# Patient Record
Sex: Male | Born: 1970 | Race: White | Hispanic: No | Smoking: Former smoker
Health system: Southern US, Community
[De-identification: ages and names within clinical notes are randomized; demographics above are authoritative.]

## PROBLEM LIST (undated history)

## (undated) DIAGNOSIS — T7840XA Allergy, unspecified, initial encounter: Secondary | ICD-10-CM

## (undated) HISTORY — PX: TONSILLECTOMY AND ADENOIDECTOMY: SUR1326

## (undated) HISTORY — DX: Allergy, unspecified, initial encounter: T78.40XA

---

## 2000-08-27 ENCOUNTER — Ambulatory Visit (HOSPITAL_BASED_OUTPATIENT_CLINIC_OR_DEPARTMENT_OTHER): Admission: RE | Admit: 2000-08-27 | Discharge: 2000-08-27 | Payer: Self-pay | Admitting: General Surgery

## 2014-09-18 ENCOUNTER — Ambulatory Visit (INDEPENDENT_AMBULATORY_CARE_PROVIDER_SITE_OTHER): Payer: BLUE CROSS/BLUE SHIELD | Admitting: Family Medicine

## 2014-09-18 ENCOUNTER — Encounter: Payer: Self-pay | Admitting: Family Medicine

## 2014-09-18 VITALS — BP 114/80 | HR 65 | Ht 73.0 in | Wt 170.0 lb

## 2014-09-18 DIAGNOSIS — A630 Anogenital (venereal) warts: Secondary | ICD-10-CM | POA: Diagnosis not present

## 2014-09-18 DIAGNOSIS — Z23 Encounter for immunization: Secondary | ICD-10-CM

## 2014-09-18 DIAGNOSIS — Z209 Contact with and (suspected) exposure to unspecified communicable disease: Secondary | ICD-10-CM

## 2014-09-18 DIAGNOSIS — Z0000 Encounter for general adult medical examination without abnormal findings: Secondary | ICD-10-CM | POA: Diagnosis not present

## 2014-09-18 LAB — CBC WITH DIFFERENTIAL/PLATELET
BASOS ABS: 0 10*3/uL (ref 0.0–0.1)
Basophils Relative: 0 % (ref 0–1)
EOS ABS: 0.1 10*3/uL (ref 0.0–0.7)
EOS PCT: 1 % (ref 0–5)
HCT: 42.9 % (ref 39.0–52.0)
HEMOGLOBIN: 14.3 g/dL (ref 13.0–17.0)
Lymphocytes Relative: 29 % (ref 12–46)
Lymphs Abs: 2.9 10*3/uL (ref 0.7–4.0)
MCH: 31 pg (ref 26.0–34.0)
MCHC: 33.3 g/dL (ref 30.0–36.0)
MCV: 92.9 fL (ref 78.0–100.0)
MPV: 10.8 fL (ref 8.6–12.4)
Monocytes Absolute: 1 10*3/uL (ref 0.1–1.0)
Monocytes Relative: 10 % (ref 3–12)
NEUTROS PCT: 60 % (ref 43–77)
Neutro Abs: 6 10*3/uL (ref 1.7–7.7)
Platelets: 317 10*3/uL (ref 150–400)
RBC: 4.62 MIL/uL (ref 4.22–5.81)
RDW: 13.7 % (ref 11.5–15.5)
WBC: 10 10*3/uL (ref 4.0–10.5)

## 2014-09-18 LAB — LIPID PANEL
CHOLESTEROL: 197 mg/dL (ref 0–200)
HDL: 58 mg/dL (ref >=40)
LDL CALC: 127 mg/dL — AB (ref 0–99)
Total CHOL/HDL Ratio: 3.4 Ratio
Triglycerides: 62 mg/dL (ref <150)
VLDL: 12 mg/dL (ref 0–40)

## 2014-09-18 LAB — COMPREHENSIVE METABOLIC PANEL
ALBUMIN: 4.2 g/dL (ref 3.5–5.2)
ALK PHOS: 48 U/L (ref 39–117)
ALT: 10 U/L (ref 0–53)
AST: 14 U/L (ref 0–37)
BILIRUBIN TOTAL: 0.5 mg/dL (ref 0.2–1.2)
BUN: 10 mg/dL (ref 6–23)
CHLORIDE: 101 meq/L (ref 96–112)
CO2: 28 meq/L (ref 19–32)
CREATININE: 0.76 mg/dL (ref 0.50–1.35)
Calcium: 10 mg/dL (ref 8.4–10.5)
Glucose, Bld: 99 mg/dL (ref 70–99)
Potassium: 4.4 mEq/L (ref 3.5–5.3)
Sodium: 138 mEq/L (ref 135–145)
Total Protein: 7 g/dL (ref 6.0–8.3)
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
## 2014-09-18 NOTE — Patient Instructions (Signed)
Mike Leach [phone] 

## 2014-09-19 LAB — GC/CHLAMYDIA PROBE AMP, URINE
Chlamydia, Swab/Urine, PCR: NEGATIVE
GC PROBE AMP, URINE: NEGATIVE

## 2014-09-19 LAB — HIV ANTIBODY (ROUTINE TESTING W REFLEX): HIV 1&2 Ab, 4th Generation: NONREACTIVE

## 2014-09-19 LAB — RPR

## 2014-09-24 NOTE — Progress Notes (Signed)
Make sure the patient knows the results were negative

## 2016-01-08 ENCOUNTER — Encounter: Payer: Self-pay | Admitting: Family Medicine

## 2016-01-08 ENCOUNTER — Ambulatory Visit (INDEPENDENT_AMBULATORY_CARE_PROVIDER_SITE_OTHER): Payer: BLUE CROSS/BLUE SHIELD | Admitting: Family Medicine

## 2016-01-08 VITALS — BP 112/60 | HR 60 | Resp 16 | Wt 166.2 lb

## 2016-01-08 DIAGNOSIS — M79606 Pain in leg, unspecified: Secondary | ICD-10-CM

## 2016-01-08 NOTE — Progress Notes (Signed)
   Subjective:    Patient ID: Mike Leach, male    DOB: 21-Jul-1970, 45 y.o.   MRN: [id number]  HPI He complains of back pain that has been intermittent in nature. It would first be on one side and then the opposite side depending upon which side he was spending the most time on while working. He works as a Conservation officer, naturecashier. He has been doing some stretching for this. He does recognize that his work plays a major role in this. He did have one episode of tingling sensation in his left leg with symptoms suggestive of L5 radiculopathy with a slight flip-flop that went away within less than a day.  Review of Systems     Objective:   Physical Exam Alert and in no distress. Slight tenderness to palpation over the left SI joint with provocative testing causing some discomfort. Normal hip motion. Negative straight leg raising.       Assessment & Plan:  Musculoskeletal pain of lower extremity, unspecified laterality Discussed the fact that since the pain moved from one side to the other based on his work posturing, this is really musculoskeletal in nature. Discussed in detail proper ergonomics in terms of keeping his back straight and lifting with his legs and not doing any rotating type motions. At the end of the encounter, his friend came in and stated that he was apparently having difficulty with depression. He did not make mention of this well with me. Since there was some suicidal thoughts, I recommended that he go to was along the emergency room. See note from Laureen Ochsiana Turner

## 2016-01-08 NOTE — Patient Instructions (Signed)
Work on keeping your back in the neutral position and not twisting intact. Stretching before and after work is fine

## 2016-02-04 ENCOUNTER — Encounter: Payer: Self-pay | Admitting: Family Medicine

## 2016-02-04 ENCOUNTER — Ambulatory Visit (INDEPENDENT_AMBULATORY_CARE_PROVIDER_SITE_OTHER): Payer: BLUE CROSS/BLUE SHIELD | Admitting: Licensed Clinical Social Worker

## 2016-02-04 ENCOUNTER — Ambulatory Visit (INDEPENDENT_AMBULATORY_CARE_PROVIDER_SITE_OTHER): Payer: BLUE CROSS/BLUE SHIELD | Admitting: Family Medicine

## 2016-02-04 VITALS — BP 120/80 | HR 60 | Ht 71.5 in | Wt 169.0 lb

## 2016-02-04 DIAGNOSIS — E785 Hyperlipidemia, unspecified: Secondary | ICD-10-CM | POA: Diagnosis not present

## 2016-02-04 DIAGNOSIS — Z8249 Family history of ischemic heart disease and other diseases of the circulatory system: Secondary | ICD-10-CM

## 2016-02-04 DIAGNOSIS — Z0000 Encounter for general adult medical examination without abnormal findings: Secondary | ICD-10-CM | POA: Diagnosis not present

## 2016-02-04 DIAGNOSIS — F329 Major depressive disorder, single episode, unspecified: Secondary | ICD-10-CM

## 2016-02-04 DIAGNOSIS — J301 Allergic rhinitis due to pollen: Secondary | ICD-10-CM | POA: Insufficient documentation

## 2016-02-04 DIAGNOSIS — F321 Major depressive disorder, single episode, moderate: Secondary | ICD-10-CM

## 2016-02-04 LAB — CBC WITH DIFFERENTIAL/PLATELET
BASOS ABS: 0 {cells}/uL (ref 0–200)
Basophils Relative: 0 %
EOS ABS: 75 {cells}/uL (ref 15–500)
Eosinophils Relative: 1 %
HCT: 42.3 % (ref 38.5–50.0)
HEMOGLOBIN: 14 g/dL (ref 13.2–17.1)
LYMPHS ABS: 2550 {cells}/uL (ref 850–3900)
Lymphocytes Relative: 34 %
MCH: 31.4 pg (ref 27.0–33.0)
MCHC: 33.1 g/dL (ref 32.0–36.0)
MCV: 94.8 fL (ref 80.0–100.0)
MONOS PCT: 12 %
MPV: 10.7 fL (ref 7.5–12.5)
Monocytes Absolute: 900 cells/uL (ref 200–950)
NEUTROS ABS: 3975 {cells}/uL (ref 1500–7800)
Neutrophils Relative %: 53 %
Platelets: 338 10*3/uL (ref 140–400)
RBC: 4.46 MIL/uL (ref 4.20–5.80)
RDW: 14 % (ref 11.0–15.0)
WBC: 7.5 10*3/uL (ref 4.0–10.5)

## 2016-02-04 LAB — COMPREHENSIVE METABOLIC PANEL
ALBUMIN: 4.3 g/dL (ref 3.6–5.1)
ALT: 13 U/L (ref 9–46)
AST: 17 U/L (ref 10–40)
Alkaline Phosphatase: 52 U/L (ref 40–115)
BUN: 11 mg/dL (ref 7–25)
CHLORIDE: 101 mmol/L (ref 98–110)
CO2: 27 mmol/L (ref 20–31)
Calcium: 9 mg/dL (ref 8.6–10.3)
Creat: 0.73 mg/dL (ref 0.60–1.35)
Glucose, Bld: 98 mg/dL (ref 65–99)
POTASSIUM: 4 mmol/L (ref 3.5–5.3)
Sodium: 137 mmol/L (ref 135–146)
TOTAL PROTEIN: 7 g/dL (ref 6.1–8.1)
Total Bilirubin: 0.4 mg/dL (ref 0.2–1.2)

## 2016-02-04 LAB — LIPID PANEL
CHOL/HDL RATIO: 2.6 ratio (ref <=5.0)
CHOLESTEROL: 184 mg/dL (ref 125–200)
HDL: 71 mg/dL (ref >=40)
LDL Cholesterol: 103 mg/dL (ref <130)
TRIGLYCERIDES: 52 mg/dL (ref <150)
VLDL: 10 mg/dL (ref <30)

## 2016-02-04 MED ORDER — CITALOPRAM HYDROBROMIDE 20 MG PO TABS: 20.0000 mg | ORAL_TABLET | Freq: Every day | ORAL | 3 refills | Status: DC

## 2016-02-04 NOTE — Patient Instructions (Addendum)
Try Flonase nasal spray your allergies Call me in 3 weeks and let me know how you're doing

## 2016-02-04 NOTE — Progress Notes (Signed)
Subjective:    Patient ID: Mike Leach, male    DOB: [birth year], 45 y.o.   MRN: [id number]  HPI Here for complete exam. He is now involved in counseling with Mike Leach. She thinks that he would do well with an antidepressant. He still is having difficulty with low mood, anhedonia, decreased energy. He is having difficulty dealing with a failed relationship. He is making progress with her and does feel good about this. He does have underlying allergies and does occasionally use Benadryl mainly in the spring. Review of his record does indicate a slightly elevated LDL. He has a family history of heart disease with his father having an MI at age 50. He continues working 3 separate jobs. He seems be doing somewhat better with his musculoskeletal issues. Otherwise he has no concerns or complaints. Family and social history as well as health maintenance and immunizations were reviewed.   Review of Systems  All other systems reviewed and are negative.      Objective:   Physical Exam BP 120/80   Pulse 60   Ht 5' 11.5" (1.816 m)   Wt 169 lb (76.7 kg)   BMI 23.24 kg/m   General Appearance:    Alert, cooperative, no distress, appears stated age  Head:    Normocephalic, without obvious abnormality, atraumatic  Eyes:    PERRL, conjunctiva/corneas clear, EOM's intact, fundi    benign  Ears:    Normal TM's and external ear canals  Nose:   Nares normal, mucosa normal, no drainage or sinus   tenderness  Throat:   Lips, mucosa, and tongue normal; teeth and gums normal  Neck:   Supple, no lymphadenopathy;  thyroid:  no   enlargement/tenderness/nodules; no carotid   bruit or JVD     Lungs:     Clear to auscultation bilaterally without wheezes, rales or     ronchi; respirations unlabored      Heart:    Regular rate and rhythm, S1 and S2 normal, no murmur, rub   or gallop     Abdomen:     Soft, non-tender, nondistended, normoactive bowel sounds,    no masses, no hepatosplenomegaly  Genitalia:     Normal male external genitalia without lesions.  Testicles without masses.  No inguinal hernias.  Rectal:    Deferred   Extremities:   No clubbing, cyanosis or edema  Pulses:   2+ and symmetric all extremities  Skin:   Skin color, texture, turgor normal, no rashes or lesions  Lymph nodes:   Cervical, supraclavicular, and axillary nodes normal  Neurologic:   CNII-XII intact, normal strength, sensation and gait; reflexes 2+ and symmetric throughout          Psych:   Normal mood, affect, hygiene and grooming.    EKG      Assessment & Plan:  Routine general medical examination at a health care facility - Plan: EKG 12-Lead, CBC with Differential/Platelet, Comprehensive metabolic panel, Lipid panel  Reactive depression - Plan: citalopram (CELEXA) 20 MG tablet  Chronic seasonal allergic rhinitis due to pollen  Family history of heart disease in male family member before age 60 - Plan: EKG 12-Lead, CBC with Differential/Platelet, Comprehensive metabolic panel, Lipid panel  Hyperlipidemia, unspecified hyperlipidemia type - Plan: Lipid panel Is to call me in 3 weeks to let me know how he is doing with his Celexa. I may increase it at that point. He will continue in counseling. Recommended he try Flonase for his allergies. Discussed his  risk of heart disease concerning family history and lipids. Explained that I would probably place him on a statin. Stool cards given

## 2016-02-10 ENCOUNTER — Ambulatory Visit (INDEPENDENT_AMBULATORY_CARE_PROVIDER_SITE_OTHER): Payer: BLUE CROSS/BLUE SHIELD | Admitting: Licensed Clinical Social Worker

## 2016-02-10 DIAGNOSIS — F321 Major depressive disorder, single episode, moderate: Secondary | ICD-10-CM

## 2016-02-21 ENCOUNTER — Telehealth: Payer: Self-pay

## 2016-02-21 DIAGNOSIS — F329 Major depressive disorder, single episode, unspecified: Secondary | ICD-10-CM

## 2016-02-21 MED ORDER — CITALOPRAM HYDROBROMIDE 20 MG PO TABS: 20.0000 mg | ORAL_TABLET | Freq: Every day | ORAL | 0 refills | Status: DC

## 2016-02-21 NOTE — Telephone Encounter (Signed)
Request for 90 day  supply

## 2016-02-25 ENCOUNTER — Ambulatory Visit: Payer: BLUE CROSS/BLUE SHIELD | Admitting: Licensed Clinical Social Worker

## 2016-03-09 ENCOUNTER — Ambulatory Visit (INDEPENDENT_AMBULATORY_CARE_PROVIDER_SITE_OTHER): Payer: BLUE CROSS/BLUE SHIELD | Admitting: Licensed Clinical Social Worker

## 2016-03-09 ENCOUNTER — Other Ambulatory Visit: Payer: Self-pay | Admitting: Family Medicine

## 2016-03-09 ENCOUNTER — Telehealth: Payer: Self-pay | Admitting: Family Medicine

## 2016-03-09 DIAGNOSIS — F324 Major depressive disorder, single episode, in partial remission: Secondary | ICD-10-CM

## 2016-03-09 MED ORDER — CITALOPRAM HYDROBROMIDE 40 MG PO TABS: 40.0000 mg | ORAL_TABLET | Freq: Every day | ORAL | 3 refills | Status: DC

## 2016-03-09 NOTE — Progress Notes (Signed)
He is now involved in therapy and his therapist thinks we need to go higher with his medicine. I will call in the 40 mg and have him return here in one month.

## 2016-03-09 NOTE — Telephone Encounter (Signed)
Left message on cell

## 2016-03-09 NOTE — Telephone Encounter (Signed)
I will increase his medicine. Have him in for a recheck in one month.

## 2016-03-09 NOTE — Telephone Encounter (Signed)
Pt says he is doing well on Celexa. He saw Berniece AndreasJulie Whitt today who told him that she thinks that the Celexa dose needs to be increased

## 2016-03-27 ENCOUNTER — Ambulatory Visit (INDEPENDENT_AMBULATORY_CARE_PROVIDER_SITE_OTHER): Payer: BLUE CROSS/BLUE SHIELD | Admitting: Licensed Clinical Social Worker

## 2016-03-27 DIAGNOSIS — F324 Major depressive disorder, single episode, in partial remission: Secondary | ICD-10-CM

## 2016-04-10 ENCOUNTER — Encounter: Payer: Self-pay | Admitting: Family Medicine

## 2016-04-10 ENCOUNTER — Ambulatory Visit (INDEPENDENT_AMBULATORY_CARE_PROVIDER_SITE_OTHER): Payer: BLUE CROSS/BLUE SHIELD | Admitting: Family Medicine

## 2016-04-10 VITALS — BP 120/72 | HR 68 | Wt 174.6 lb

## 2016-04-10 DIAGNOSIS — F329 Major depressive disorder, single episode, unspecified: Secondary | ICD-10-CM | POA: Diagnosis not present

## 2016-04-10 NOTE — Progress Notes (Signed)
   Subjective:    Patient ID: Mike Leach, male    DOB: Aug 31, 1970, 45 y.o.   MRN: [id number]  HPI He is here for recheck. He continues to be involved in counseling with Berniece AndreasJulie Whitt and is making good progress. He has learned a lot about himself and is learning about the relationship he needs to have with himself. Presently he is on 40 mg of Celexa which is helping him process through this whole issue. He feels very good about this.   Review of Systems     Objective:   Physical Exam Alert and in no distress with appropriate affect.       Assessment & Plan:  Reactive depression He will continue on his present dosing regimen. We did have a long discussion with him concerning making sure that he learns the right lessons from this relationship and especially spending time getting back in touch with himself and that being the most important relationship that he has. He now does have better insight into this.

## 2016-04-22 ENCOUNTER — Ambulatory Visit: Payer: Self-pay | Admitting: Licensed Clinical Social Worker

## 2016-05-05 ENCOUNTER — Ambulatory Visit (INDEPENDENT_AMBULATORY_CARE_PROVIDER_SITE_OTHER): Payer: BLUE CROSS/BLUE SHIELD | Admitting: Licensed Clinical Social Worker

## 2016-05-05 DIAGNOSIS — F324 Major depressive disorder, single episode, in partial remission: Secondary | ICD-10-CM

## 2016-06-01 ENCOUNTER — Other Ambulatory Visit: Payer: Self-pay | Admitting: Medical

## 2016-06-01 ENCOUNTER — Ambulatory Visit: Payer: Self-pay | Admitting: Licensed Clinical Social Worker

## 2016-06-01 DIAGNOSIS — F329 Major depressive disorder, single episode, unspecified: Secondary | ICD-10-CM

## 2016-06-01 NOTE — Telephone Encounter (Signed)
Is this okay to refill? 

## 2016-07-29 ENCOUNTER — Ambulatory Visit: Payer: BLUE CROSS/BLUE SHIELD | Admitting: Licensed Clinical Social Worker

## 2016-08-04 ENCOUNTER — Encounter: Payer: Self-pay | Admitting: Family Medicine

## 2016-08-04 ENCOUNTER — Ambulatory Visit (INDEPENDENT_AMBULATORY_CARE_PROVIDER_SITE_OTHER): Payer: BLUE CROSS/BLUE SHIELD | Admitting: Family Medicine

## 2016-08-04 VITALS — BP 142/90 | HR 78 | Wt 173.8 lb

## 2016-08-04 DIAGNOSIS — R413 Other amnesia: Secondary | ICD-10-CM | POA: Diagnosis not present

## 2016-08-04 LAB — LIPID PANEL
CHOL/HDL RATIO: 2.6 ratio (ref <5.0)
Cholesterol: 197 mg/dL (ref <200)
HDL: 76 mg/dL (ref >40)
LDL Cholesterol: 106 mg/dL — ABNORMAL HIGH (ref <100)
Triglycerides: 76 mg/dL (ref <150)
VLDL: 15 mg/dL (ref <30)

## 2016-08-04 LAB — COMPREHENSIVE METABOLIC PANEL
ALK PHOS: 60 U/L (ref 40–115)
ALT: 12 U/L (ref 9–46)
AST: 18 U/L (ref 10–40)
Albumin: 4.3 g/dL (ref 3.6–5.1)
BILIRUBIN TOTAL: 0.4 mg/dL (ref 0.2–1.2)
BUN: 12 mg/dL (ref 7–25)
CO2: 24 mmol/L (ref 20–31)
Calcium: 9.7 mg/dL (ref 8.6–10.3)
Chloride: 101 mmol/L (ref 98–110)
Creat: 0.79 mg/dL (ref 0.60–1.35)
GLUCOSE: 134 mg/dL — AB (ref 65–99)
POTASSIUM: 4.3 mmol/L (ref 3.5–5.3)
Sodium: 138 mmol/L (ref 135–146)
TOTAL PROTEIN: 7.1 g/dL (ref 6.1–8.1)

## 2016-08-04 LAB — CBC WITH DIFFERENTIAL/PLATELET
BASOS ABS: 0 {cells}/uL (ref 0–200)
Basophils Relative: 0 %
EOS ABS: 90 {cells}/uL (ref 15–500)
Eosinophils Relative: 1 %
HCT: 44.6 % (ref 38.5–50.0)
Hemoglobin: 14.5 g/dL (ref 13.2–17.1)
LYMPHS PCT: 29 %
Lymphs Abs: 2610 cells/uL (ref 850–3900)
MCH: 31.5 pg (ref 27.0–33.0)
MCHC: 32.5 g/dL (ref 32.0–36.0)
MCV: 96.7 fL (ref 80.0–100.0)
MONOS PCT: 9 %
MPV: 10.8 fL (ref 7.5–12.5)
Monocytes Absolute: 810 cells/uL (ref 200–950)
NEUTROS PCT: 61 %
Neutro Abs: 5490 cells/uL (ref 1500–7800)
PLATELETS: 338 10*3/uL (ref 140–400)
RBC: 4.61 MIL/uL (ref 4.20–5.80)
RDW: 12.9 % (ref 11.0–15.0)
WBC: 9 10*3/uL (ref 4.0–10.5)
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
## 2016-08-06 ENCOUNTER — Encounter: Payer: Self-pay | Admitting: Neurology

## 2016-08-12 ENCOUNTER — Ambulatory Visit (INDEPENDENT_AMBULATORY_CARE_PROVIDER_SITE_OTHER): Payer: BLUE CROSS/BLUE SHIELD | Admitting: Licensed Clinical Social Worker

## 2016-08-12 DIAGNOSIS — F324 Major depressive disorder, single episode, in partial remission: Secondary | ICD-10-CM

## 2016-08-19 ENCOUNTER — Other Ambulatory Visit: Payer: Self-pay | Admitting: Family Medicine

## 2016-08-19 NOTE — Telephone Encounter (Signed)
Is this okay to refill? 

## 2016-08-24 ENCOUNTER — Encounter: Payer: Self-pay | Admitting: Neurology

## 2016-08-24 ENCOUNTER — Ambulatory Visit (INDEPENDENT_AMBULATORY_CARE_PROVIDER_SITE_OTHER): Payer: BLUE CROSS/BLUE SHIELD | Admitting: Neurology

## 2016-08-24 VITALS — BP 130/68 | HR 72 | Ht 73.0 in | Wt 178.0 lb

## 2016-08-24 DIAGNOSIS — R404 Transient alteration of awareness: Secondary | ICD-10-CM | POA: Diagnosis not present

## 2016-08-24 DIAGNOSIS — R55 Syncope and collapse: Secondary | ICD-10-CM

## 2016-08-24 NOTE — Patient Instructions (Signed)
1. Schedule MRI brain with and without contrast 2. Schedule 1-hour sleep-deprived EEG 3. Schedule 48-hour EEG 4. Schedule 2-D echocardiogram 5. As per [place] driving laws, no driving after an episode of loss of consciousness until 6 months event-free 6. Follow-up after tests
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
## 2016-09-08 ENCOUNTER — Other Ambulatory Visit: Payer: Self-pay | Admitting: Neurology

## 2016-09-08 ENCOUNTER — Other Ambulatory Visit: Payer: Self-pay

## 2016-09-08 ENCOUNTER — Encounter (INDEPENDENT_AMBULATORY_CARE_PROVIDER_SITE_OTHER): Payer: Self-pay

## 2016-09-08 ENCOUNTER — Ambulatory Visit (HOSPITAL_COMMUNITY): Payer: BLUE CROSS/BLUE SHIELD | Attending: Cardiovascular Disease

## 2016-09-08 DIAGNOSIS — I517 Cardiomegaly: Secondary | ICD-10-CM | POA: Diagnosis not present

## 2016-09-08 DIAGNOSIS — R55 Syncope and collapse: Secondary | ICD-10-CM

## 2016-09-08 DIAGNOSIS — R404 Transient alteration of awareness: Secondary | ICD-10-CM

## 2016-09-11 ENCOUNTER — Telehealth: Payer: Self-pay

## 2016-09-11 NOTE — Telephone Encounter (Signed)
Called pt to relay message below.  No answer.  Voicemail box is full.  Unable to leave message.

## 2016-09-11 NOTE — Telephone Encounter (Signed)
-----   Message from Van ClinesKaren M Aquino, MD sent at 09/11/2016  6:01 AM EDT ----- Pls let pt know echo is overall normal, there were mild changes seen as with patients with hypertension. Thanks

## 2016-09-17 ENCOUNTER — Ambulatory Visit (INDEPENDENT_AMBULATORY_CARE_PROVIDER_SITE_OTHER): Payer: BLUE CROSS/BLUE SHIELD | Admitting: Licensed Clinical Social Worker

## 2016-09-17 DIAGNOSIS — F324 Major depressive disorder, single episode, in partial remission: Secondary | ICD-10-CM | POA: Diagnosis not present

## 2016-09-24 ENCOUNTER — Ambulatory Visit
Admission: RE | Admit: 2016-09-24 | Discharge: 2016-09-24 | Disposition: A | Discharge status: Home / Self Care | Payer: BLUE CROSS/BLUE SHIELD | Source: Ambulatory Visit | Attending: Neurology | Admitting: Neurology

## 2016-09-24 DIAGNOSIS — R55 Syncope and collapse: Secondary | ICD-10-CM

## 2016-09-24 DIAGNOSIS — R404 Transient alteration of awareness: Secondary | ICD-10-CM

## 2016-09-24 MED ORDER — GADOBENATE DIMEGLUMINE 529 MG/ML IV SOLN
15.0000 mL | Freq: Once | INTRAVENOUS | Status: AC | PRN
  Administered 2016-09-24: 15 mL via INTRAVENOUS

## 2016-09-25 ENCOUNTER — Ambulatory Visit (INDEPENDENT_AMBULATORY_CARE_PROVIDER_SITE_OTHER): Payer: BLUE CROSS/BLUE SHIELD | Admitting: Neurology

## 2016-09-25 DIAGNOSIS — R404 Transient alteration of awareness: Secondary | ICD-10-CM

## 2016-09-25 DIAGNOSIS — R55 Syncope and collapse: Secondary | ICD-10-CM

## 2016-10-06 ENCOUNTER — Ambulatory Visit (INDEPENDENT_AMBULATORY_CARE_PROVIDER_SITE_OTHER): Payer: BLUE CROSS/BLUE SHIELD | Admitting: Neurology

## 2016-10-06 ENCOUNTER — Encounter: Payer: Self-pay | Admitting: Neurology

## 2016-10-06 VITALS — BP 110/74 | HR 82 | Ht 73.0 in | Wt 178.0 lb

## 2016-10-06 DIAGNOSIS — R55 Syncope and collapse: Secondary | ICD-10-CM | POA: Diagnosis not present

## 2016-10-06 DIAGNOSIS — R404 Transient alteration of awareness: Secondary | ICD-10-CM | POA: Diagnosis not present

## 2016-10-06 NOTE — Patient Instructions (Signed)
It was great seeing you! Proceed with 48-hour EEG as discussed. As per [place] driving laws, no driving until 6 months event-free. Follow-up after EEG, call for any changes.
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
## 2016-10-09 NOTE — Procedures (Signed)
ELECTROENCEPHALOGRAM REPORT  Date of Study: 09/25/2016  Patient's Name: Mike Leach MRN: [id number] Date of Birth: [birth year]  Referring Provider: Dr. Patrcia DollyKaren Herberto Ledwell  Clinical History: This is a 46 year old man with an episode of loss of awareness last April 2018, woke up on the floor. EEG for classification.  Medications: Celexa, Tylenol  Technical Summary: A multichannel digital 1-hour EEG recording measured by the international 10-20 system with electrodes applied with paste and impedances below 5000 ohms performed in our laboratory with EKG monitoring in an awake and drowsy patient.  Hyperventilation and photic stimulation were performed.  The digital EEG was referentially recorded, reformatted, and digitally filtered in a variety of bipolar and referential montages for optimal display.    Description: The patient is awake and drowsy during the recording.  During maximal wakefulness, there is a symmetric, medium voltage 11 Hz posterior dominant rhythm that attenuates with eye opening.  The record is symmetric.  During drowsiness, there is an increase in theta slowing of the background. Deeper stages of sleep were not seen.  Hyperventilation and photic stimulation did not elicit any abnormalities.  There were no epileptiform discharges or electrographic seizures seen.    EKG lead was unremarkable.  Impression: This 1-hour awake and drowsy EEG is normal.    Clinical Correlation: A normal EEG does not exclude a clinical diagnosis of epilepsy.  If further clinical questions remain, prolonged EEG may be helpful.  Clinical correlation is advised.   Patrcia DollyKaren Sundee Garland, M.D.

## 2016-10-13 ENCOUNTER — Encounter: Payer: BLUE CROSS/BLUE SHIELD | Admitting: Family Medicine

## 2016-10-15 ENCOUNTER — Ambulatory Visit (INDEPENDENT_AMBULATORY_CARE_PROVIDER_SITE_OTHER): Payer: BLUE CROSS/BLUE SHIELD | Admitting: Licensed Clinical Social Worker

## 2016-10-15 DIAGNOSIS — F324 Major depressive disorder, single episode, in partial remission: Secondary | ICD-10-CM | POA: Diagnosis not present

## 2016-10-21 ENCOUNTER — Encounter: Payer: Self-pay | Admitting: Family Medicine

## 2016-10-21 ENCOUNTER — Ambulatory Visit (INDEPENDENT_AMBULATORY_CARE_PROVIDER_SITE_OTHER): Payer: BLUE CROSS/BLUE SHIELD | Admitting: Family Medicine

## 2016-10-21 VITALS — BP 120/84 | HR 66 | Wt 177.0 lb

## 2016-10-21 DIAGNOSIS — F4329 Adjustment disorder with other symptoms: Secondary | ICD-10-CM

## 2016-10-21 DIAGNOSIS — R404 Transient alteration of awareness: Secondary | ICD-10-CM

## 2016-10-21 DIAGNOSIS — M461 Sacroiliitis, not elsewhere classified: Secondary | ICD-10-CM | POA: Diagnosis not present

## 2016-10-21 NOTE — Progress Notes (Signed)
   Subjective:    Patient ID: Mike Leach, male    DOB: [birth year], 46 y.o.   MRN: [id number]  HPI He is here for a recheck. He is still being followed by neurology for evaluation of his altered consciousness. He continues in counseling with Berniece AndreasJulie Whitt and states that he does think he is making progress. He is dealing with stress and relationship issues. He also continues have difficulty with left-sided low back pain especially with work requires him to been slightly forward on medication.   Review of Systems     Objective:   Physical Exam Alert and in no distress with appropriate affect. Slight tenderness to palpation over the left SI joint.       Assessment & Plan:  Transient alteration of awareness  Sacroiliitis (HCC)  Stress and adjustment reaction He will continue in counseling with his therapist. I strongly encouraged him to spend his time and energy is to working on the relationship that he has with himself and not worry about other relationships. He will continue to be followed by neurology to ensure that there is no other neurologic issue. The symptoms do sound very much like a parasomnia. Recommend heat and stretching as well as possible massage for his SI joint dysfunction. Greater than 25 minutes, greater than 50% spent in counseling and coordination of care.

## 2016-11-03 ENCOUNTER — Ambulatory Visit (INDEPENDENT_AMBULATORY_CARE_PROVIDER_SITE_OTHER): Payer: BLUE CROSS/BLUE SHIELD | Admitting: Licensed Clinical Social Worker

## 2016-11-03 DIAGNOSIS — F324 Major depressive disorder, single episode, in partial remission: Secondary | ICD-10-CM

## 2016-11-16 ENCOUNTER — Ambulatory Visit (INDEPENDENT_AMBULATORY_CARE_PROVIDER_SITE_OTHER): Payer: BLUE CROSS/BLUE SHIELD | Admitting: Neurology

## 2016-11-16 DIAGNOSIS — R55 Syncope and collapse: Secondary | ICD-10-CM | POA: Diagnosis not present

## 2016-11-16 DIAGNOSIS — R404 Transient alteration of awareness: Secondary | ICD-10-CM

## 2016-11-17 DIAGNOSIS — R55 Syncope and collapse: Secondary | ICD-10-CM | POA: Diagnosis not present

## 2016-11-26 ENCOUNTER — Ambulatory Visit: Payer: BLUE CROSS/BLUE SHIELD | Admitting: Licensed Clinical Social Worker

## 2016-12-02 ENCOUNTER — Telehealth: Payer: Self-pay

## 2016-12-02 NOTE — Telephone Encounter (Signed)
LMOM relaying message below.  Asked that pt return my call to see if below date and time work for him.

## 2016-12-02 NOTE — Procedures (Signed)
ELECTROENCEPHALOGRAM REPORT  Dates of Recording: 11/16/2016 10:27AM to 11/18/2016 9:38AM  Patient's Name: Mike Leach MRN: [id number] Date of Birth: June 01, 1970  Referring Provider: Dr. Patrcia Dolly  Procedure: 48-hour ambulatory EEG  History: This is a 46 year old man with an episode of loss of awareness last April 2018, woke up on the floor. EEG for classification.  Medications: Celexa, Tylenol  Technical Summary: This is a 48-hour multichannel digital EEG recording measured by the international 10-20 system with electrodes applied with paste and impedances below 5000 ohms performed as portable with EKG monitoring.  The digital EEG was referentially recorded, reformatted, and digitally filtered in a variety of bipolar and referential montages for optimal display.    DESCRIPTION OF RECORDING: During maximal wakefulness, the background activity consisted of a symmetric 10-11 Hz posterior dominant rhythm which was reactive to eye opening.  There were no epileptiform discharges or focal slowing seen in wakefulness.  During the recording, the patient progresses through wakefulness, drowsiness, and Stage 2 sleep.  Again, there were no epileptiform discharges seen.  Events: No push button events. Left FP1 electrode artifact starts 07:05 hours into recording, right FP2 artifact starts at 08:01 hours into recording.  There were no electrographic seizures seen.  EKG lead was unremarkable.  IMPRESSION: This 48-hour ambulatory EEG study is normal.    CLINICAL CORRELATION: A normal EEG does not exclude a clinical diagnosis of epilepsy. Typical events were not captured. If further clinical questions remain, inpatient video EEG monitoring may be helpful.   Patrcia Dolly, M.D.

## 2016-12-02 NOTE — Telephone Encounter (Signed)
-----   Message from [name], MD sent at 12/02/2016 10:40 AM EDT ----- Pls let him know the 48-hour EEG was normal, I have an opening on Fri at 8:30am if he can come in to discuss next steps. Thanks

## 2016-12-07 NOTE — Telephone Encounter (Signed)
LMOM asking that pt's mother return my call.

## 2016-12-07 NOTE — Telephone Encounter (Signed)
Mother Maralyn Sago) called. She said he didn't get his message but would like for you to call her please. Thanks

## 2016-12-10 ENCOUNTER — Ambulatory Visit (INDEPENDENT_AMBULATORY_CARE_PROVIDER_SITE_OTHER): Payer: BLUE CROSS/BLUE SHIELD | Admitting: Neurology

## 2016-12-10 ENCOUNTER — Encounter: Payer: Self-pay | Admitting: Neurology

## 2016-12-10 VITALS — BP 138/64 | HR 76 | Ht 73.0 in | Wt 170.0 lb

## 2016-12-10 DIAGNOSIS — R55 Syncope and collapse: Secondary | ICD-10-CM | POA: Diagnosis not present

## 2016-12-10 DIAGNOSIS — R404 Transient alteration of awareness: Secondary | ICD-10-CM

## 2016-12-10 NOTE — Patient Instructions (Signed)
Great seeing you! Continue to monitor symptoms. Follow-up in 6 months, call for any changes. As per [place] driving laws, no driving after an episode of loss of consciousness, until 6 months event-free.
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
## 2016-12-17 ENCOUNTER — Ambulatory Visit: Payer: BLUE CROSS/BLUE SHIELD | Admitting: Licensed Clinical Social Worker

## 2016-12-30 ENCOUNTER — Ambulatory Visit: Payer: BLUE CROSS/BLUE SHIELD | Admitting: Licensed Clinical Social Worker

## 2017-01-20 ENCOUNTER — Telehealth: Payer: Self-pay

## 2017-01-20 ENCOUNTER — Ambulatory Visit: Payer: BLUE CROSS/BLUE SHIELD | Admitting: Licensed Clinical Social Worker

## 2017-01-20 NOTE — Telephone Encounter (Signed)
LMOM relaying message below.  

## 2017-01-20 NOTE — Telephone Encounter (Signed)
Pt returned call - states the he and Dr. Karel Jarvis went over MRI results in the office.  He was worried that maybe he was being confused with another pt;  Let him know that no, no mix up with pt's.  Dr. Karel Jarvis might have just forgotten that they spoke about results already.

## 2017-01-20 NOTE — Telephone Encounter (Signed)
-----   Message from [name], MD sent at 01/19/2017  4:23 PM EDT ----- Pls let patient/wife know that the MRI brain did not show any evidence of tumor, stroke, or bleed. Thanks

## 2017-06-14 ENCOUNTER — Ambulatory Visit: Payer: BLUE CROSS/BLUE SHIELD | Admitting: Neurology

## 2019-01-22 IMAGING — MR MR HEAD WO/W CM
10 series · 48 of 48 positions shown · IV contrast (multihance)
Comparison: None.

CLINICAL DATA: Memory loss.  Syncopal episode.  Negative EEG.

EXAM:
MRI HEAD WITHOUT AND WITH CONTRAST
TECHNIQUE: Multiplanar, multiecho pulse sequences of the brain and surrounding
structures were obtained without and with intravenous contrast.
CONTRAST:  15mL MULTIHANCE GADOBENATE DIMEGLUMINE 529 MG/ML IV SOLN

[Series 2: t1_se_sag · sagittal · 5.0mm · 0.45mm/px · 2 of 21 slices shown]
[im 1/21]
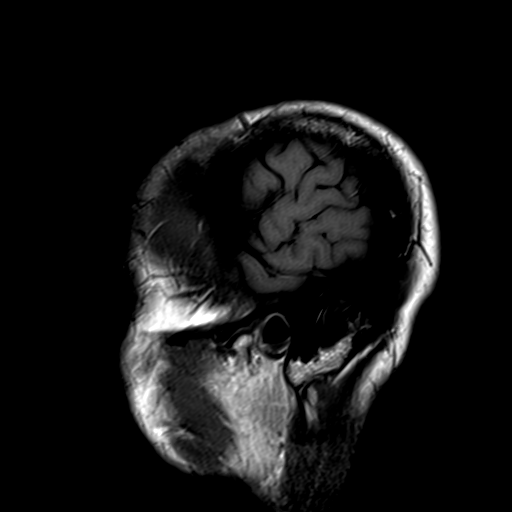
[im 21/21]
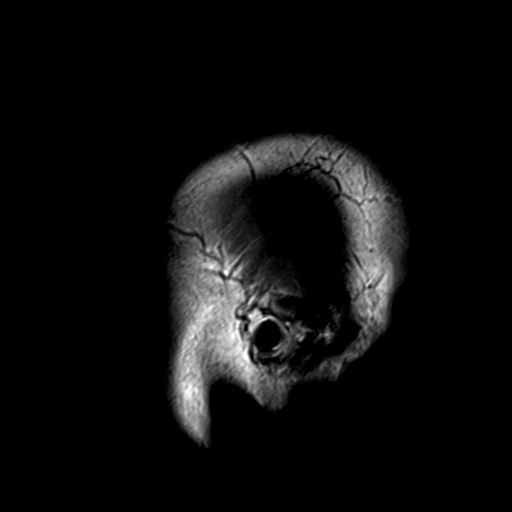

[Series 3: ep2d_diff_(id)_trace · axial · 3.0mm · 1.80mm/px · z∈[-49,+98]mm · 7 of 98 slices shown]
[im 1/98]
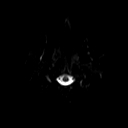
[im 17/98]
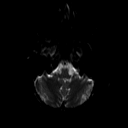
[im 33/98]
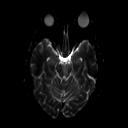
[im 49/98]
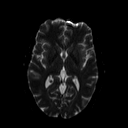
[im 65/98]
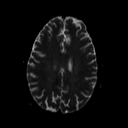
[im 81/98]
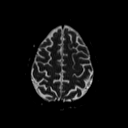
[im 98/98]
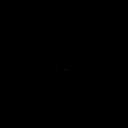

[Series 4: ep2d_diff_(id)_trace_adc · axial · 3.0mm · 1.80mm/px · z∈[-49,+98]mm · 3 of 46 slices shown]
[im 1/46]
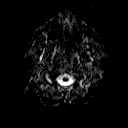
[im 23/46]
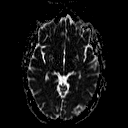
[im 46/46]
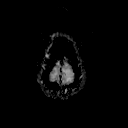

[Series 6: swi_images · axial · 2.0mm · 0.90mm/px · z∈[-56,+102]mm · 6 of 80 slices shown]
[im 1/80]
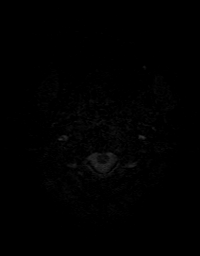
[im 16/80]
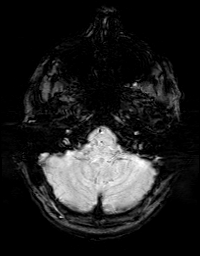
[im 32/80]
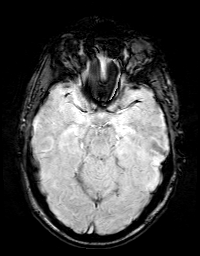
[im 48/80]
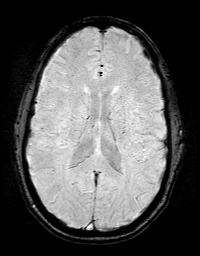
[im 64/80]
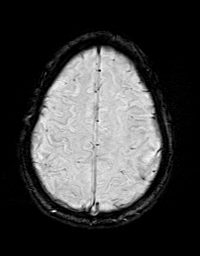
[im 80/80]
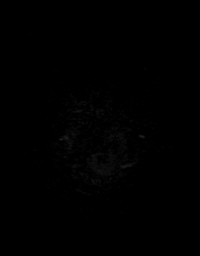

[Series 7: FLAIR · axial · 3.0mm · 0.45mm/px · z∈[-59,+91]mm · 4 of 51 slices shown]
[im 1/51]
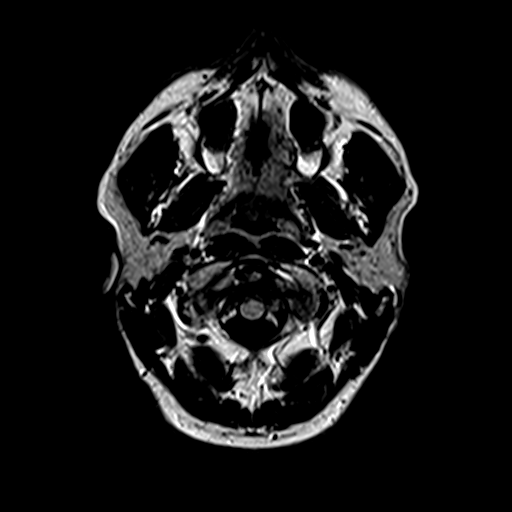
[im 17/51]
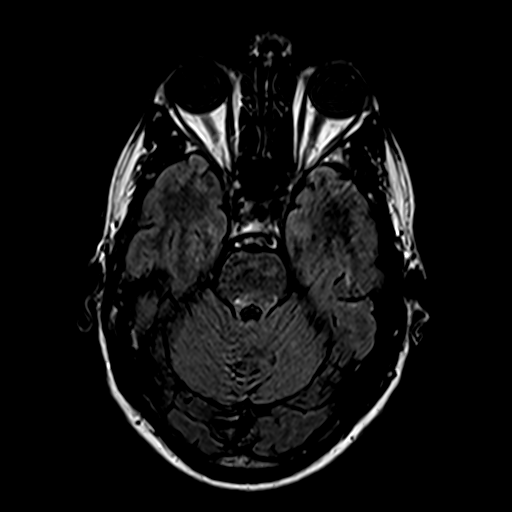
[im 34/51]
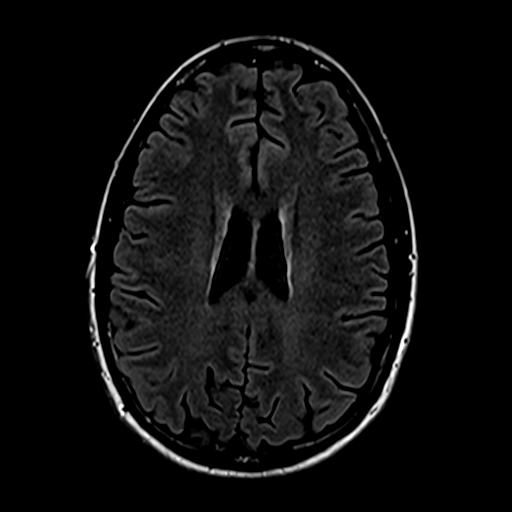
[im 51/51]
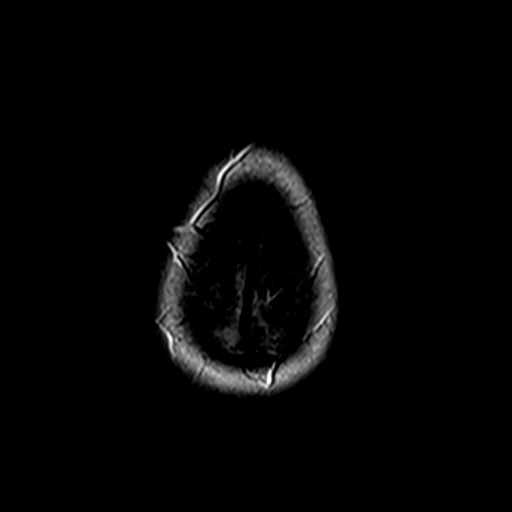

[Series 8: t2_tse_tra_512 · axial · 5.0mm · 0.60mm/px · z∈[-58,+86]mm · 2 of 25 slices shown]
[im 1/25]
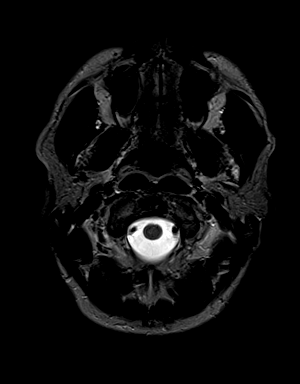
[im 25/25]
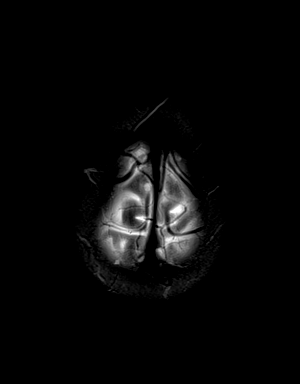

[Series 9: t1_mpr_tra · axial · 1.0mm · 0.72mm/px · z∈[-50,+92]mm · 10 of 144 slices shown]
[im 1/144]
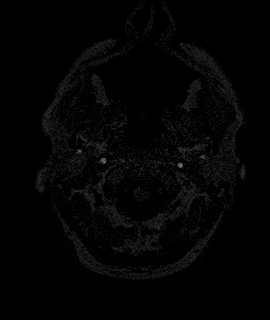
[im 16/144]
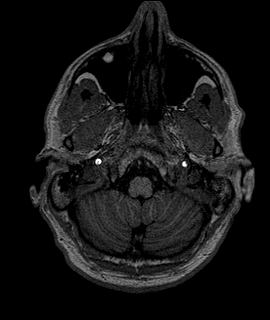
[im 32/144]
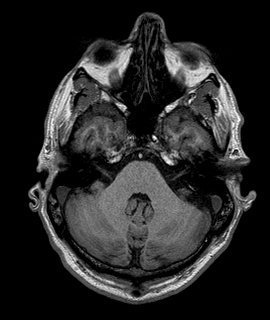
[im 48/144]
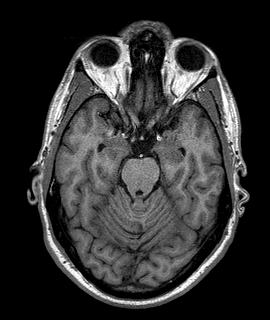
[im 64/144]
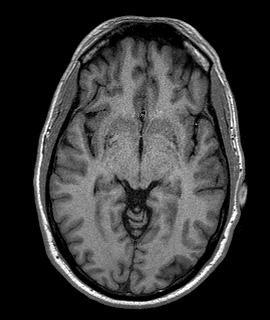
[im 80/144]
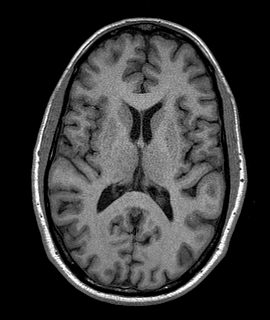
[im 96/144]
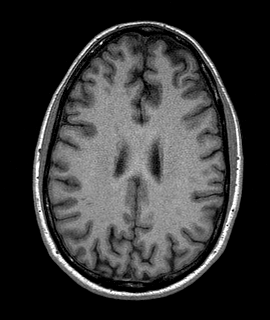
[im 112/144]
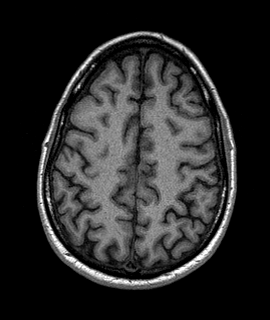
[im 128/144]
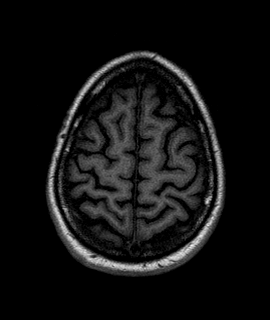
[im 144/144]
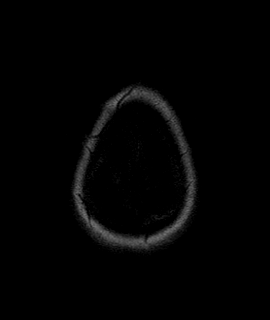

[Series 10: T2 · coronal · 5.0mm · 0.45mm/px · 2 of 25 slices shown]
[im 1/25]
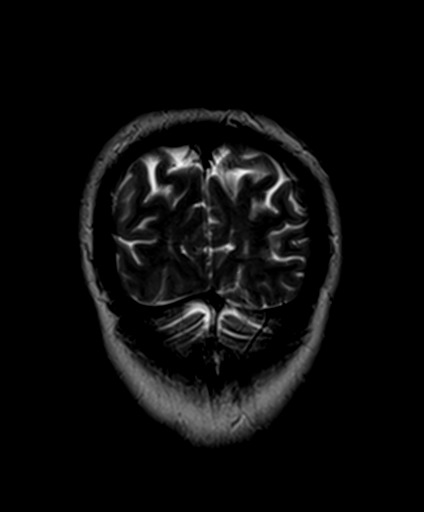
[im 25/25]
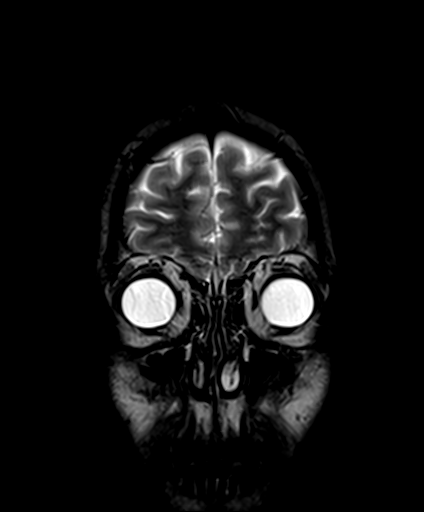

[Series 11: T1 post-contrast · coronal · 5.0mm · 0.72mm/px · 2 of 25 slices shown]
[im 1/25]
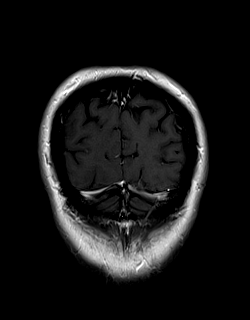
[im 25/25]
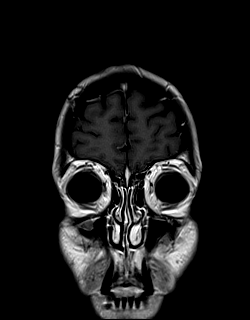

[Series 12: post t1_mpr_tra · axial · 1.0mm · 0.72mm/px · z∈[-50,+92]mm · 10 of 144 slices shown]
[im 1/144]
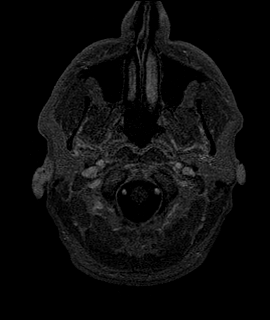
[im 16/144]
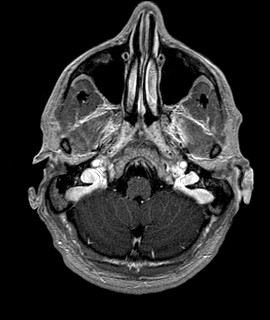
[im 32/144]
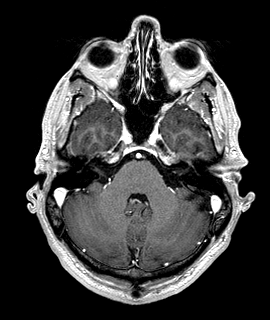
[im 48/144]
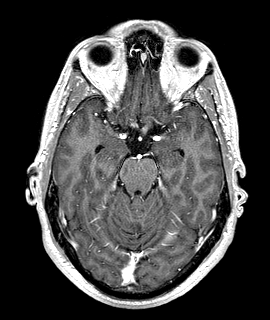
[im 64/144]
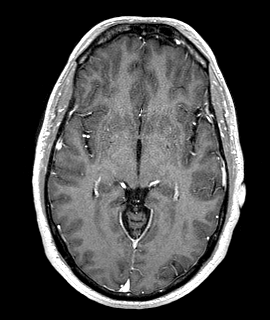
[im 80/144]
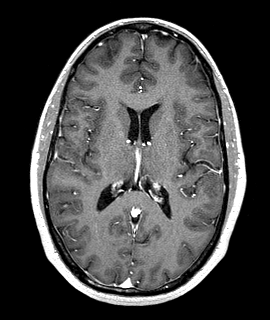
[im 96/144]
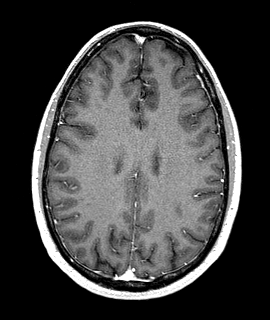
[im 112/144]
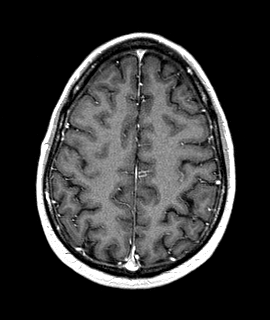
[im 128/144]
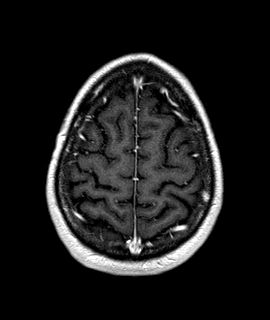
[im 144/144]
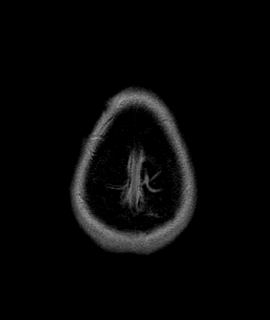

[48 of 48 positions shown; findings below may reference images not displayed]

FINDINGS: Brain: No acute infarction, hemorrhage, hydrocephalus, extra-axial
collection or mass lesion. No cortical finding to suggest a seizure
focus. Normal brain volume.

Vascular: Major vessels are patent

Skull and upper cervical spine: Negative for marrow lesion

Sinuses/Orbits: 9 mm left paracentral anterior nasal septum mass
with T1 hyperintensity and no visible enhancement. Small
proteinaceous retention cyst in the right maxillary sinus.
IMPRESSION: 1. Negative brain MRI.  No explanation for episodes.
2. Incidental 9 mm lipoma or proteinaceous cyst in the anterior
nasal septum.
